# Patient Record
Sex: Male | Born: 1980 | Race: White | Hispanic: No | Smoking: Never smoker
Health system: Southern US, Community
[De-identification: ages and names within clinical notes are randomized; demographics above are authoritative.]

## PROBLEM LIST (undated history)

## (undated) DIAGNOSIS — I10 Essential (primary) hypertension: Secondary | ICD-10-CM

## (undated) HISTORY — PX: OTHER SURGICAL HISTORY: SHX169

---

## 2004-11-01 ENCOUNTER — Emergency Department (HOSPITAL_COMMUNITY): Admission: EM | Admit: 2004-11-01 | Discharge: 2004-11-02 | Payer: Self-pay | Admitting: Emergency Medicine

## 2006-02-19 ENCOUNTER — Emergency Department (HOSPITAL_COMMUNITY): Admission: EM | Admit: 2006-02-19 | Discharge: 2006-02-19 | Payer: Self-pay | Admitting: Family Medicine

## 2007-04-08 ENCOUNTER — Emergency Department (HOSPITAL_COMMUNITY): Admission: EM | Admit: 2007-04-08 | Discharge: 2007-04-08 | Payer: Self-pay | Admitting: Emergency Medicine

## 2007-05-12 ENCOUNTER — Emergency Department (HOSPITAL_COMMUNITY): Admission: EM | Admit: 2007-05-12 | Discharge: 2007-05-12 | Payer: Self-pay | Admitting: Emergency Medicine

## 2008-03-03 ENCOUNTER — Ambulatory Visit: Payer: Self-pay | Admitting: Family Medicine

## 2008-03-03 ENCOUNTER — Encounter (INDEPENDENT_AMBULATORY_CARE_PROVIDER_SITE_OTHER): Payer: Self-pay | Admitting: Family Medicine

## 2008-03-03 DIAGNOSIS — R030 Elevated blood-pressure reading, without diagnosis of hypertension: Secondary | ICD-10-CM | POA: Insufficient documentation

## 2008-03-03 DIAGNOSIS — R5383 Other fatigue: Secondary | ICD-10-CM

## 2008-03-03 DIAGNOSIS — R5381 Other malaise: Secondary | ICD-10-CM | POA: Insufficient documentation

## 2008-03-03 LAB — CONVERTED CEMR LAB
ALT: 13 units/L (ref 0–53)
AST: 12 units/L (ref 0–37)
Albumin: 4.8 g/dL (ref 3.5–5.2)
Alkaline Phosphatase: 50 units/L (ref 39–117)
BUN: 18 mg/dL (ref 6–23)
CO2: 24 meq/L (ref 19–32)
Calcium: 9.6 mg/dL (ref 8.4–10.5)
Chloride: 104 meq/L (ref 96–112)
Cholesterol: 243 mg/dL — ABNORMAL HIGH (ref 0–200)
Creatinine, Ser: 0.84 mg/dL (ref 0.40–1.50)
Direct LDL: 189 mg/dL — ABNORMAL HIGH
Glucose, Bld: 87 mg/dL (ref 70–99)
HCT: 46.1 % (ref 39.0–52.0)
HDL: 40 mg/dL (ref >39)
Hemoglobin: 15.4 g/dL (ref 13.0–17.0)
LDL Cholesterol: 161 mg/dL — ABNORMAL HIGH (ref 0–99)
MCHC: 33.4 g/dL (ref 30.0–36.0)
MCV: 83.8 fL (ref 78.0–100.0)
Platelets: 254 10*3/uL (ref 150–400)
Potassium: 4.2 meq/L (ref 3.5–5.3)
RBC: 5.5 M/uL (ref 4.22–5.81)
RDW: 13.1 % (ref 11.5–15.5)
Sodium: 140 meq/L (ref 135–145)
TSH: 2.173 microintl units/mL (ref 0.350–5.50)
Total Bilirubin: 0.4 mg/dL (ref 0.3–1.2)
Total CHOL/HDL Ratio: 6.1
Total Protein: 8 g/dL (ref 6.0–8.3)
Triglycerides: 212 mg/dL — ABNORMAL HIGH (ref <150)
VLDL: 42 mg/dL — ABNORMAL HIGH (ref 0–40)
WBC: 4.1 10*3/uL (ref 4.0–10.5)

## 2008-03-04 ENCOUNTER — Encounter (INDEPENDENT_AMBULATORY_CARE_PROVIDER_SITE_OTHER): Payer: Self-pay | Admitting: Family Medicine

## 2008-04-07 ENCOUNTER — Encounter (INDEPENDENT_AMBULATORY_CARE_PROVIDER_SITE_OTHER): Payer: Self-pay | Admitting: Family Medicine

## 2008-04-07 ENCOUNTER — Ambulatory Visit (HOSPITAL_COMMUNITY): Admission: RE | Admit: 2008-04-07 | Discharge: 2008-04-07 | Payer: Self-pay | Admitting: Family Medicine

## 2008-04-07 ENCOUNTER — Ambulatory Visit: Payer: Self-pay | Admitting: Family Medicine

## 2008-04-07 DIAGNOSIS — E785 Hyperlipidemia, unspecified: Secondary | ICD-10-CM | POA: Insufficient documentation

## 2008-04-07 DIAGNOSIS — R002 Palpitations: Secondary | ICD-10-CM | POA: Insufficient documentation

## 2008-04-07 DIAGNOSIS — G43909 Migraine, unspecified, not intractable, without status migrainosus: Secondary | ICD-10-CM | POA: Insufficient documentation

## 2008-04-07 LAB — CONVERTED CEMR LAB
CO2: 25 meq/L (ref 19–32)
Calcium: 9.4 mg/dL (ref 8.4–10.5)
Chloride: 105 meq/L (ref 96–112)
Creatinine, Ser: 0.91 mg/dL (ref 0.40–1.50)
Potassium: 4 meq/L (ref 3.5–5.3)
Sodium: 141 meq/L (ref 135–145)
TSH: 1.01 microintl units/mL (ref 0.350–5.50)

## 2008-04-09 ENCOUNTER — Encounter (INDEPENDENT_AMBULATORY_CARE_PROVIDER_SITE_OTHER): Payer: Self-pay | Admitting: Family Medicine

## 2008-04-12 ENCOUNTER — Encounter (INDEPENDENT_AMBULATORY_CARE_PROVIDER_SITE_OTHER): Payer: Self-pay | Admitting: Family Medicine

## 2008-05-14 ENCOUNTER — Encounter: Admission: RE | Admit: 2008-05-14 | Discharge: 2008-05-14 | Payer: Self-pay | Admitting: Family Medicine

## 2008-05-14 ENCOUNTER — Ambulatory Visit: Payer: Self-pay | Admitting: Family Medicine

## 2008-05-14 ENCOUNTER — Telehealth: Payer: Self-pay | Admitting: *Deleted

## 2008-05-14 ENCOUNTER — Encounter (INDEPENDENT_AMBULATORY_CARE_PROVIDER_SITE_OTHER): Payer: Self-pay | Admitting: Family Medicine

## 2008-05-14 DIAGNOSIS — M79609 Pain in unspecified limb: Secondary | ICD-10-CM | POA: Insufficient documentation

## 2008-05-14 LAB — CONVERTED CEMR LAB
BUN: 15 mg/dL (ref 6–23)
CO2: 22 meq/L (ref 19–32)
Calcium: 9.8 mg/dL (ref 8.4–10.5)
Chloride: 104 meq/L (ref 96–112)
Creatinine, Ser: 0.81 mg/dL (ref 0.40–1.50)
Glucose, Bld: 85 mg/dL (ref 70–99)
Potassium: 3.9 meq/L (ref 3.5–5.3)
Sodium: 140 meq/L (ref 135–145)

## 2008-11-20 ENCOUNTER — Emergency Department (HOSPITAL_COMMUNITY): Admission: EM | Admit: 2008-11-20 | Discharge: 2008-11-20 | Payer: Self-pay | Admitting: Family Medicine

## 2009-01-12 ENCOUNTER — Emergency Department (HOSPITAL_COMMUNITY): Admission: EM | Admit: 2009-01-12 | Discharge: 2009-01-12 | Payer: Self-pay | Admitting: Family Medicine

## 2011-08-31 LAB — POCT RAPID STREP A: Streptococcus, Group A Screen (Direct): NEGATIVE

## 2011-09-12 LAB — COMPREHENSIVE METABOLIC PANEL
ALT: 19
AST: 16
Albumin: 4
Alkaline Phosphatase: 44
BUN: 12
CO2: 28
Calcium: 9
Chloride: 102
Creatinine, Ser: 1.02
GFR calc Af Amer: >60
GFR calc non Af Amer: >60
Glucose, Bld: 106 — ABNORMAL HIGH
Potassium: 3.4 — ABNORMAL LOW
Sodium: 135
Total Bilirubin: 0.5
Total Protein: 6.7

## 2011-09-12 LAB — DIFFERENTIAL
Basophils Absolute: 0
Basophils Relative: 1
Eosinophils Absolute: 0.1
Eosinophils Relative: 2
Lymphocytes Relative: 39
Lymphs Abs: 2
Monocytes Absolute: 0.4
Monocytes Relative: 8
Neutro Abs: 2.6
Neutrophils Relative %: 50

## 2011-09-12 LAB — CBC
HCT: 42.8
Hemoglobin: 14.4
MCHC: 33.7
MCV: 83.1
Platelets: 238
RBC: 5.15
RDW: 13.3
WBC: 5.1

## 2011-09-12 LAB — LIPASE, BLOOD: Lipase: 27

## 2012-05-27 ENCOUNTER — Emergency Department (HOSPITAL_COMMUNITY)
Admission: EM | Admit: 2012-05-27 | Discharge: 2012-05-27 | Disposition: A | Discharge status: Home / Self Care | Payer: Self-pay | Attending: Emergency Medicine | Admitting: Emergency Medicine

## 2012-05-27 ENCOUNTER — Encounter (HOSPITAL_COMMUNITY): Payer: Self-pay | Admitting: *Deleted

## 2012-05-27 ENCOUNTER — Emergency Department (HOSPITAL_COMMUNITY): Payer: Self-pay

## 2012-05-27 DIAGNOSIS — I10 Essential (primary) hypertension: Secondary | ICD-10-CM | POA: Insufficient documentation

## 2012-05-27 DIAGNOSIS — W1809XA Striking against other object with subsequent fall, initial encounter: Secondary | ICD-10-CM | POA: Insufficient documentation

## 2012-05-27 DIAGNOSIS — R079 Chest pain, unspecified: Secondary | ICD-10-CM | POA: Insufficient documentation

## 2012-05-27 DIAGNOSIS — S2239XA Fracture of one rib, unspecified side, initial encounter for closed fracture: Secondary | ICD-10-CM | POA: Insufficient documentation

## 2012-05-27 DIAGNOSIS — S2231XA Fracture of one rib, right side, initial encounter for closed fracture: Secondary | ICD-10-CM

## 2012-05-27 HISTORY — DX: Essential (primary) hypertension: I10

## 2012-05-27 NOTE — ED Notes (Signed)
The pt slipped and fell in the bathtub on Friday.  He has had some rt lateral rib  Pain since then

## 2012-05-27 NOTE — ED Provider Notes (Signed)
History     CSN: [id number]  Arrival date & time 05/27/12  1191   First MD Initiated Contact with Patient 05/27/12 2027      Chief Complaint  Patient presents with  . Rib Injury    (Consider location/radiation/quality/duration/timing/severity/associated sxs/prior treatment) HPI  31 year old male presents complaining of right rib pain. Patient reports he accidentally slipped and fell, hitting edge of bathtub several days ago has had pain to his right ribs since.  Pain is primarily to his right lower chest. Pain is sharp and throbbing, nonradiating. Pain worsened with taking deep breath, cough, or with palpation. Denies fever, hemoptysis, productive cough, or abdominal pain. Denies swelling or bruising or abnormal bleeding. Has tried ibuprofen with some relief. Denies any other injury.  Past Medical History  Diagnosis Date  . Hypertension     History reviewed. No pertinent past surgical history.  No family history on file.  History  Substance Use Topics  . Smoking status: Never Smoker   . Smokeless tobacco: Not on file  . Alcohol Use: No      Review of Systems  Constitutional: Negative for fever and chills.  Respiratory: Negative for cough, chest tightness, shortness of breath and wheezing.   Cardiovascular: Positive for chest pain.  Skin: Negative for rash and wound.  All other systems reviewed and are negative.    Allergies  Review of patient's allergies indicates no known allergies.  Home Medications   Current Outpatient Rx  Name Route Sig Dispense Refill  . IBUPROFEN 200 MG PO TABS Oral Take 200 mg by mouth every 6 (six) hours as needed. For pain      BP 151/95  Pulse 70  Temp 98.3 F (36.8 C) (Oral)  Resp 16  SpO2 98%  Physical Exam  Nursing note and vitals reviewed. Constitutional: He is oriented to person, place, and time. He appears well-developed and well-nourished. No distress.  HENT:  Head: Atraumatic.  Eyes: Conjunctivae are normal.  Neck:  Neck supple.  Cardiovascular: Normal rate and regular rhythm.  Exam reveals no gallop and no friction rub.   No murmur heard. Pulmonary/Chest: Effort normal and breath sounds normal. No respiratory distress. He has no wheezes. He has no rales. He exhibits tenderness.    Abdominal: Soft. There is no tenderness.  Musculoskeletal: Normal range of motion.  Neurological: He is alert and oriented to person, place, and time.  Skin: Skin is warm. No rash noted.    ED Course  Procedures (including critical care time)  Labs Reviewed - No data to display No results found.   No diagnosis found.  Results for orders placed during the hospital encounter of 11/20/08  POCT RAPID STREP A (DEVICE)      Component Value Range   Streptococcus, Group A Screen (Direct) NEGATIVE  NEGATIVE   Dg Ribs Unilateral W/chest Right  05/27/2012  *RADIOLOGY REPORT*  Clinical Data: Rib injury.  Pain under the right breast.  Slipped and fell on Friday.  RIGHT RIBS AND CHEST - 3+ VIEW  Comparison: Chest x-ray 05/12/2007  Findings: Cardiomediastinal silhouette is within normal limits. The lungs are free of focal consolidations and pleural effusions. Oblique views demonstrate a minimally displaced fracture of the right seventh rib.  IMPRESSION:  1.  No evidence for acute cardiopulmonary disease. 2.  Fracture of the right lateral seventh rib.  Original Report Authenticated By: Patterson Hammersmith, M.D.    1. Right rib fracture  MDM  Right rib pain after falling. X-ray revealed a fracture  of the right lateral seventh rib. Care instruction given. Incentive spirometer getting along with appropriate usage.  Pt voice understanding.         Fayrene Helper, PA-C 05/27/12 2114

## 2012-05-28 NOTE — ED Provider Notes (Signed)
Medical screening examination/treatment/procedure(s) were performed by non-physician practitioner and as supervising physician I was immediately available for consultation/collaboration.  Taiyo Kozma R. Takiera Mayo, MD 05/28/12 0010 

## 2018-02-08 ENCOUNTER — Other Ambulatory Visit: Payer: Self-pay

## 2018-02-08 ENCOUNTER — Emergency Department (HOSPITAL_COMMUNITY)
Admission: EM | Admit: 2018-02-08 | Discharge: 2018-02-09 | Disposition: A | Discharge status: Home / Self Care | Payer: BLUE CROSS/BLUE SHIELD | Attending: Emergency Medicine | Admitting: Emergency Medicine

## 2018-02-08 ENCOUNTER — Emergency Department (HOSPITAL_COMMUNITY): Payer: BLUE CROSS/BLUE SHIELD

## 2018-02-08 DIAGNOSIS — Y999 Unspecified external cause status: Secondary | ICD-10-CM | POA: Diagnosis not present

## 2018-02-08 DIAGNOSIS — S8264XA Nondisplaced fracture of lateral malleolus of right fibula, initial encounter for closed fracture: Secondary | ICD-10-CM | POA: Insufficient documentation

## 2018-02-08 DIAGNOSIS — Y939 Activity, unspecified: Secondary | ICD-10-CM | POA: Diagnosis not present

## 2018-02-08 DIAGNOSIS — S92324A Nondisplaced fracture of second metatarsal bone, right foot, initial encounter for closed fracture: Secondary | ICD-10-CM | POA: Diagnosis not present

## 2018-02-08 DIAGNOSIS — I10 Essential (primary) hypertension: Secondary | ICD-10-CM | POA: Insufficient documentation

## 2018-02-08 DIAGNOSIS — Y92410 Unspecified street and highway as the place of occurrence of the external cause: Secondary | ICD-10-CM | POA: Diagnosis not present

## 2018-02-08 DIAGNOSIS — S99921A Unspecified injury of right foot, initial encounter: Secondary | ICD-10-CM | POA: Diagnosis present

## 2018-02-08 MED ORDER — OXYCODONE-ACETAMINOPHEN 5-325 MG PO TABS
1.0000 | ORAL_TABLET | Freq: Once | ORAL | Status: AC
Start: 2018-02-09 — End: 2018-02-09
  Administered 2018-02-09: 1 via ORAL
  Filled 2018-02-08: qty 1
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
## 2018-02-09 DIAGNOSIS — S92324A Nondisplaced fracture of second metatarsal bone, right foot, initial encounter for closed fracture: Secondary | ICD-10-CM | POA: Diagnosis not present

## 2018-02-09 MED ORDER — TRAMADOL HCL 50 MG PO TABS: 50.0000 mg | ORAL_TABLET | Freq: Four times a day (QID) | ORAL | 0 refills | Status: DC | PRN
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
## 2018-02-20 ENCOUNTER — Other Ambulatory Visit: Payer: Self-pay | Admitting: Specialist

## 2018-02-20 DIAGNOSIS — M79671 Pain in right foot: Secondary | ICD-10-CM

## 2018-02-28 ENCOUNTER — Other Ambulatory Visit: Payer: BLUE CROSS/BLUE SHIELD

## 2018-02-28 ENCOUNTER — Ambulatory Visit
Admission: RE | Admit: 2018-02-28 | Discharge: 2018-02-28 | Disposition: A | Discharge status: Home / Self Care | Payer: BLUE CROSS/BLUE SHIELD | Source: Ambulatory Visit | Attending: Specialist | Admitting: Specialist

## 2018-02-28 DIAGNOSIS — M79671 Pain in right foot: Secondary | ICD-10-CM

## 2018-07-01 ENCOUNTER — Ambulatory Visit: Payer: BLUE CROSS/BLUE SHIELD | Admitting: Physician Assistant

## 2018-07-01 ENCOUNTER — Other Ambulatory Visit: Payer: Self-pay

## 2018-07-01 ENCOUNTER — Encounter: Payer: Self-pay | Admitting: Physician Assistant

## 2018-07-01 VITALS — BP 118/82 | HR 70 | Temp 98.0°F | Resp 16 | Ht 73.62 in | Wt 213.0 lb

## 2018-07-01 DIAGNOSIS — Z23 Encounter for immunization: Secondary | ICD-10-CM | POA: Diagnosis not present

## 2018-07-01 DIAGNOSIS — Z7689 Persons encountering health services in other specified circumstances: Secondary | ICD-10-CM

## 2018-07-01 DIAGNOSIS — S80922A Unspecified superficial injury of left lower leg, initial encounter: Secondary | ICD-10-CM

## 2018-07-01 DIAGNOSIS — T148XXA Other injury of unspecified body region, initial encounter: Secondary | ICD-10-CM

## 2018-07-01 NOTE — Patient Instructions (Addendum)
Follow up for complete physical exam when you are ready. Thank you for letting me participate in your health and well being.   Abrasion An abrasion is a cut or scrape on the surface of your skin. An abrasion does not go through all of the layers of your skin. It is important to take good care of your abrasion to prevent infection. Follow these instructions at home: Medicines  Take or apply medicines only as told by your doctor.  If you were prescribed an antibiotic ointment, finish all of it even if you start to feel better. Wound care  Clean the wound with mild soap and water 2-3 times per day or as told by your doctor. Pat your wound dry with a clean towel. Do not rub it.  There are many ways to close and cover a wound. Follow instructions from your doctor about: ? How to take care of your wound. ? When and how you should change your bandage (dressing). ? When and how you should take off your dressing.  Check your wound every day for signs of infection. Watch for: ? Redness, swelling, or pain. ? Fluid, blood, or pus. General instructions  Keep the dressing dry as told by your doctor. Do not take baths, swim, use a hot tub, or do anything that would put your wound underwater until your doctor says it is okay.  If there is swelling, raise (elevate) the injured area above the level of your heart while you are sitting or lying down.  Keep all follow-up visits as told by your doctor. This is important. Contact a doctor if:  You were given a tetanus shot and you have any of these where the needle went in: ? Swelling. ? Very bad pain. ? Redness. ? Bleeding.  Medicine does not help your pain.  You have any of these at the site of the wound: ? More redness. ? More swelling. ? More pain. Get help right away if:  You have a red streak going away from your wound.  You have a fever.  You have fluid, blood, or pus coming from your wound.  There is a bad smell coming from your  wound. This information is not intended to replace advice given to you by your health care provider. Make sure you discuss any questions you have with your health care provider. Document Released: 04/30/2008 Document Revised: 04/19/2016 Document Reviewed: 11/10/2014 Elsevier Interactive Patient Education  2018 [employer]    IF you received an x-ray today, you will receive an invoice from [facility]. Please contact [facility] at [phone] with questions or concerns regarding your invoice.   IF you received labwork today, you will receive an invoice from [facility]. Please contact [facility] at [phone] with questions or concerns regarding your invoice.   Our billing staff will not be able to assist you with questions regarding bills from these companies.  You will be contacted with the lab results as soon as they are available. The fastest way to get your results is to activate your My Chart account. Instructions are located on the last page of this paperwork. If you have not heard from us regarding the results in 2 weeks, please contact this office.

## 2018-07-01 NOTE — Progress Notes (Signed)
   Ryan Mcconnell  MRN: [id number] DOB: 07-27-1981  Subjective:  Ryan Mcconnell is a 37 y.o. male seen in office today for a chief complaint of skin abrasion to left lower extremity today at work. Accidentally scraped it up against a wooden palate. Wiped it down with paper towel and cleanser immediately after. Last Tdap vaccine was given in 2005. Denies smoking. No PMH of diabetes.   Also wishes to establish care. Does not have a PCP. Has not had lab work in a long time. Thinks he used to have resting HTN but has not had issues in quite some time.   Review of Systems  Constitutional: Negative for chills, diaphoresis and fever.    Patient Active Problem List   Diagnosis Date Noted  . HAND PAIN, RIGHT 05/14/2008  . HYPERLIPIDEMIA 04/07/2008  . MIGRAINE HEADACHE 04/07/2008  . PALPITATIONS 04/07/2008  . FATIGUE 03/03/2008  . ELEVATED BLOOD PRESSURE 03/03/2008    No current outpatient medications on file prior to visit.   No current facility-administered medications on file prior to visit.     No Known Allergies   Objective:  BP 118/82   Pulse 70   Temp 98 F (36.7 C) (Oral)   Resp 16   Ht 6' 1.62" (1.87 m)   Wt 213 lb (96.6 kg)   SpO2 98%   BMI 27.63 kg/m   Physical Exam  Constitutional: He is oriented to person, place, and time. He appears well-developed and well-nourished. No distress.  HENT:  Head: Normocephalic and atraumatic.  Mouth/Throat: Uvula is midline, oropharynx is clear and moist and mucous membranes are normal. No tonsillar exudate.  Eyes: Pupils are equal, round, and reactive to light. Conjunctivae and EOM are normal.  Neck: Normal range of motion.  Cardiovascular: Normal rate, regular rhythm, normal heart sounds and intact distal pulses.  Pulmonary/Chest: Effort normal and breath sounds normal. He has no decreased breath sounds. He has no wheezes. He has no rhonchi. He has no rales.  Musculoskeletal:       Right lower leg: He exhibits no swelling.       Left lower leg: He exhibits no swelling.  Neurological: He is alert and oriented to person, place, and time.  Skin: Skin is warm and dry. Abrasion (on lateral LLE, no active bleeding, no surrounding erythema, warmth, or TTP, no purulent drainage) noted.  Psychiatric: He has a normal mood and affect.  Vitals reviewed.  Wound care: Verbal consent obtained.  Wound cleansed with soap and water. Mupirocin ointment and dressing applied.  Pt tolerated well.  After care instructions given to patient.    Assessment and Plan :  1. Skin abrasion Wound cleansed and dressed in office.  No signs of infection at this time.  Given educational material for wound care.  Updated Tdap vaccine.  Educated on signs and symptoms of surrounding infection.  Follow-up as needed. - Tdap vaccine greater than or equal to 7yo IM  2. Encounter to establish care I am happy to take over patient's care.  Recommend he follow-up in the next few weeks for complete physical exam he is fasting.   Benjiman CoreBrittany Faduma Cho PA-C  [facility]  [facility] 07/01/2018 1:42 PM

## 2018-07-11 ENCOUNTER — Other Ambulatory Visit: Payer: Self-pay

## 2018-07-11 ENCOUNTER — Ambulatory Visit (INDEPENDENT_AMBULATORY_CARE_PROVIDER_SITE_OTHER): Payer: BLUE CROSS/BLUE SHIELD | Admitting: Physician Assistant

## 2018-07-11 ENCOUNTER — Encounter: Payer: Self-pay | Admitting: Physician Assistant

## 2018-07-11 VITALS — BP 131/87 | HR 84 | Temp 98.8°F | Resp 20 | Ht 73.35 in | Wt 207.8 lb

## 2018-07-11 DIAGNOSIS — Z13220 Encounter for screening for lipoid disorders: Secondary | ICD-10-CM

## 2018-07-11 DIAGNOSIS — Z13228 Encounter for screening for other metabolic disorders: Secondary | ICD-10-CM

## 2018-07-11 DIAGNOSIS — Z1389 Encounter for screening for other disorder: Secondary | ICD-10-CM

## 2018-07-11 DIAGNOSIS — Z0000 Encounter for general adult medical examination without abnormal findings: Secondary | ICD-10-CM

## 2018-07-11 DIAGNOSIS — Z130 Encounter for screening for diseases of the blood and blood-forming organs and certain disorders involving the immune mechanism: Secondary | ICD-10-CM

## 2018-07-11 DIAGNOSIS — Z113 Encounter for screening for infections with a predominantly sexual mode of transmission: Secondary | ICD-10-CM

## 2018-07-11 DIAGNOSIS — F32A Depression, unspecified: Secondary | ICD-10-CM

## 2018-07-11 DIAGNOSIS — Z0001 Encounter for general adult medical examination with abnormal findings: Secondary | ICD-10-CM | POA: Diagnosis not present

## 2018-07-11 DIAGNOSIS — I459 Conduction disorder, unspecified: Secondary | ICD-10-CM | POA: Diagnosis not present

## 2018-07-11 DIAGNOSIS — G479 Sleep disorder, unspecified: Secondary | ICD-10-CM | POA: Diagnosis not present

## 2018-07-11 DIAGNOSIS — F329 Major depressive disorder, single episode, unspecified: Secondary | ICD-10-CM

## 2018-07-11 DIAGNOSIS — F419 Anxiety disorder, unspecified: Secondary | ICD-10-CM

## 2018-07-11 MED ORDER — HYDROXYZINE HCL 25 MG PO TABS: 12.5000 mg | ORAL_TABLET | Freq: Every evening | ORAL | 0 refills | Status: DC | PRN

## 2018-07-11 MED ORDER — ESCITALOPRAM OXALATE 10 MG PO TABS: 10.0000 mg | ORAL_TABLET | Freq: Every day | ORAL | 0 refills | Status: DC

## 2018-07-11 NOTE — Progress Notes (Signed)
Ryan Mcconnell  MRN: [id number] DOB: [birth year]  Subjective:  Pt is a 37 y.o. male who presents for annual physical exam. Pt is fasting today.   Diet: "dont often eat breakfast" "lunch is often takeout, quite fond of chicken, zaxbys, food lion, not a lot of greasty food" "dinner varies, will stick with lean meats" Drinks mostly water. Will often drink 1-2 energy drinks per day (11am and mid afternoon).  Exercise: Daily, commutes via bicycle but also uses it as exercise.  Sleep: Having issues with sleep. Will average 4-6 hours a night. Staying asleep is the issue. No issues with falling asleep. Will lie in bed and read on iphone. Does snore sometimes. Denies apneic episodes. Benedryl knocks him out and makes him feel drowsy, melatonin does not help him stay asleep.  BM: Daily  Last dental exam: "been awhile" does not brush twice daily.  Last vision exam: 11/2017, wears Rx eyeglasses.   Vaccinations      Tetanus: 2019   Issues wanting to discuss: Anxiety: Has had excessive worrying for "as long as I can remember."  They are things that are mundane and should not require this much thought. He is always thinking " I need to get this done." Worries about what will happen if he does not get that done. "I will emotionally shut down every now and then, struggling with depression for sometime."  Has associated sleep disturbance. Denies panic attacks, hallucinations, euphoria, manic episodes, impulsive behaviors, compulsive behaviors, lack of need of sleep, SI or HI. No illicit drug use. No PMH of seizures.  No FH of mental illness he is aware of.   Palpitations: Occurs occasionally x "as long as I can remember."   Will feel "skipped beat." Had work up in ED 15 years ago with no definitive dx. Will occur at anytime, 1-2 x per day. No association with anything he can think of. Denies chest pain, SOB, orthopnea, lower leg swelling. Does drink caffeine daily. Only OTC medication is ibuprofen  occassioanally. No illicit drug use. No FH of heart disease or thyroid disease he is aware of.     Patient Active Problem List   Diagnosis Date Noted  . Migraine headache 04/07/2008  . Palpitations 04/07/2008    No current outpatient medications on file prior to visit.   No current facility-administered medications on file prior to visit.     No Known Allergies  Social History   Socioeconomic History  . Marital status: Divorced    Spouse name: Not on file  . Number of children: 1  . Years of education: Not on file  . Highest education level: High school graduate  Occupational History  . Not on file  Social Needs  . Financial resource strain: Not hard at all  . Food insecurity:    Worry: Never true    Inability: Never true  . Transportation needs:    Medical: No    Non-medical: No  Tobacco Use  . Smoking status: Never Smoker  . Smokeless tobacco: Never Used  Substance and Sexual Activity  . Alcohol use: Yes    Comment: 1-2 drinks per week  . Drug use: Never  . Sexual activity: Yes    Partners: Female  Lifestyle  . Physical activity:    Days per week: 7 days    Minutes per session: 100 min  . Stress: Rather much  Relationships  . Social connections:    Talks on phone: Once a week    Gets  together: Once a week    Attends religious service: Never    Active member of club or organization: No    Attends meetings of clubs or organizations: Never    Relationship status: Divorced  Other Topics Concern  . Not on file  Social History Narrative   Pt is from [place]. Moved to [place] at age 89. Divorced, one child, joint custody.     No past surgical history on file.  No family history on file.  Review of Systems  Constitutional: Negative for activity change, appetite change, chills, diaphoresis, fatigue, fever and unexpected weight change.  HENT: Negative for congestion, dental problem, drooling, ear discharge, ear pain, facial swelling, hearing loss,  mouth sores, nosebleeds, postnasal drip, rhinorrhea, sinus pressure, sinus pain, sneezing, sore throat, tinnitus, trouble swallowing and voice change.   Eyes: Negative for photophobia, pain, discharge, redness, itching and visual disturbance.  Respiratory: Negative for apnea, cough, choking, chest tightness, shortness of breath, wheezing and stridor.   Cardiovascular: Positive for palpitations. Negative for chest pain and leg swelling.  Gastrointestinal: Negative for abdominal distention, abdominal pain, anal bleeding, blood in stool, constipation, diarrhea, nausea, rectal pain and vomiting.  Endocrine: Negative for cold intolerance, heat intolerance, polydipsia, polyphagia and polyuria.  Genitourinary: Negative for decreased urine volume, difficulty urinating, discharge, dysuria, enuresis, flank pain, frequency, genital sores, hematuria, penile pain, penile swelling, scrotal swelling, testicular pain and urgency.  Musculoskeletal: Negative for arthralgias, back pain, gait problem, joint swelling, myalgias, neck pain and neck stiffness.  Skin: Negative for color change, pallor, rash and wound.  Allergic/Immunologic: Negative for environmental allergies, food allergies and immunocompromised state.  Neurological: Positive for headaches. Negative for dizziness, tremors, seizures, syncope, facial asymmetry, speech difficulty, weakness, light-headedness and numbness.  Hematological: Negative for adenopathy. Does not bruise/bleed easily.  Psychiatric/Behavioral: Positive for sleep disturbance. Negative for agitation, behavioral problems, confusion, decreased concentration, dysphoric mood, hallucinations, self-injury and suicidal ideas. The patient is nervous/anxious. The patient is not hyperactive.     Objective:  BP 131/87   Pulse 84   Temp 98.8 F (37.1 C) (Oral)   Resp 20   Ht 6' 1.35" (1.863 m)   Wt 207 lb 12.8 oz (94.3 kg)   SpO2 97%   BMI 27.16 kg/m   Physical Exam  Constitutional: He is  oriented to person, place, and time. He appears well-developed and well-nourished. No distress.  HENT:  Head: Normocephalic and atraumatic.  Right Ear: Hearing, tympanic membrane, external ear and ear canal normal.  Left Ear: Hearing, tympanic membrane, external ear and ear canal normal.  Nose: Nose normal.  Mouth/Throat: Uvula is midline, oropharynx is clear and moist and mucous membranes are normal. No oropharyngeal exudate.  Eyes: Pupils are equal, round, and reactive to light. Conjunctivae and EOM are normal.  Neck: Trachea normal and normal range of motion.  Cardiovascular: Normal rate, regular rhythm, normal heart sounds and intact distal pulses.  Pulmonary/Chest: Effort normal and breath sounds normal.  Abdominal: Soft. Normal appearance and bowel sounds are normal.  Musculoskeletal: Normal range of motion.  Lymphadenopathy:       Head (right side): No submental, no submandibular, no tonsillar, no preauricular, no posterior auricular and no occipital adenopathy present.       Head (left side): No submental, no submandibular, no tonsillar, no preauricular, no posterior auricular and no occipital adenopathy present.    He has no cervical adenopathy.       Right: No supraclavicular adenopathy present.       Left:  No supraclavicular adenopathy present.  Neurological: He is alert and oriented to person, place, and time. He has normal strength and normal reflexes.  Skin: Skin is warm and dry.  Vitals reviewed.   Visual Acuity Screening   Right eye Left eye Both eyes  Without correction: 20/25 20/25 20/20  With correction:        BP Readings from Last 3 Encounters:  07/11/18 131/87  07/01/18 118/82  02/08/18 (!) 151/100   Depression screen PHQ 2/9 07/11/2018 07/11/2018 07/01/2018  Decreased Interest 0 0 0  Down, Depressed, Hopeless 1 0 0  PHQ - 2 Score 1 0 0  Altered sleeping 3 - -  Tired, decreased energy 2 - -  Change in appetite 0 - -  Feeling bad or failure about yourself  1  - -  Trouble concentrating 0 - -  Moving slowly or fidgety/restless 1 - -  Suicidal thoughts 0 - -  PHQ-9 Score 8 - -  Difficult doing work/chores Somewhat difficult - -   GAD 7 : Generalized Anxiety Score 07/11/2018  Nervous, Anxious, on Edge 3  Control/stop worrying 2  Worry too much - different things 3  Trouble relaxing 3  Restless 1  Easily annoyed or irritable 0  Afraid - awful might happen 0  Total GAD 7 Score 12  Anxiety Difficulty Not difficult at all   EKG shows NSR with rate of 62 bpm. PR and QRS intervals within normal limits. No acute changes noted from prior EKG of 2009.     Assessment and Plan :  Discussed healthy lifestyle, diet, exercise, preventative care, vaccinations, and addressed patient's concerns. Plan for follow up in 4-6 weeks. Otherwise, plan for specific conditions below.  1. Annual physical exam Await lab results.   2. Screening for deficiency anemia - CBC with Differential/Platelet  3. Screening for metabolic disorder - IDP82+UMPN  4. Screening cholesterol level - Lipid panel  5. Screening for hematuria or proteinuria - Urinalysis, dipstick only  6. Screen for STD (sexually transmitted disease) - GC/Chlamydia Probe Amp - Hepatitis panel, acute - HIV antibody - RPR - Trichomonas vaginalis, RNA  7. Sleep disturbance Will start with treatment of anxiety with SSRI and use atarax prn for sleep. Educated on sleep hygiene. Avoid screen time while in bed.  - TSH - hydrOXYzine (ATARAX/VISTARIL) 25 MG tablet; Take 0.5-1 tablets (12.5-25 mg total) by mouth at bedtime as needed.  Dispense: 30 tablet; Refill: 0  8. Skipped heart beats Could be related to caffeine consumption and/or anxiety. EKG normal. He is asx today. Labs pending. Rec decreasing caffeine intake. Start SSRI for anxiety. F/u with cardiology for possible holter monitor.  - TSH - EKG 12-Lead - Ambulatory referral to Cardiology  9. Anxiety and depression GAD 7 questionanaire  score of 12, PHQ 9 score of 8. No SI or HI. It appears pt suffers mostly from anxiety, likely GAD. Rec SSRI at this time. Also rec monthly therapy. F/u with me in office in 4-6 weeks. - TSH - escitalopram (LEXAPRO) 10 MG tablet; Take 1 tablet (10 mg total) by mouth daily.  Dispense: 90 tablet; Refill: 0 - Vitamin B12  Side effects, risks, benefits, and alternatives of the medications and treatment plan prescribed today were discussed, and patient expressed understanding of the instructions given. No barriers to understanding were identified. Red flags discussed in detail. Pt expressed understanding regarding what to do in case of emergency/urgent symptoms.  Tenna Delaine, PA-C  [facility] 07/11/2018 11:02 AM

## 2018-07-11 NOTE — Patient Instructions (Addendum)
For depression and anxiety, I recommend we start a daily SSRI and occassional therapy. I also recommend a short term medication for moments of sleep disturbance. The SSRI I am prescribing is called lexapro. Start with half a tablet x 4 days then increase to one tablet daily. Please keep in mind that when you start an SSRI it can worsen your depression and anxiety symptoms. It can also increase risk of suicidal thoughts so if this occurs, please seek care immediately. Common side effects of SSRIs include, but are not limited to, GI upset, nausea, vomiting, insomnia, and drowsiness. Typically these side effects will resolve after 2 weeks. Please keep in mind that it can take up to 4-6 weeks for SSRIs to be fully effective. Below is some information for local therapists. I recommend you reach out to one of these therapists before our next visit.Plan to return to clinic in 6 weeks for reevaluation of symptoms.   For therapy -- [facility] ([address] [place] [facility] [place]) - [phone] Lia Hopping [facility] [place]) - [phone] [facility] - [phone] [facility] - [phone] Buena Irish - [phone] [facility]  - [phone] (do not file insurance)   For heart palpitations, I recommend decrease caffeine consumption. Follow up with cardiology for evaluation. Thank you for letting me participate in your health and well being.    If you have lab work done today you will be contacted with your lab results within the next 2 weeks.  If you have not heard from [place] then please contact us. The fastest way to get your results is to register for My Chart.  Health Maintenance, Male A healthy lifestyle and preventive care is important for your health and wellness. Ask your health care provider about what schedule of regular examinations is right for you. What should I know about  weight and diet? Eat a Healthy Diet  Eat plenty of vegetables, fruits, whole grains, low-fat dairy products, and lean protein.  Do not eat a lot of foods high in solid fats, added sugars, or salt.  Maintain a Healthy Weight Regular exercise can help you achieve or maintain a healthy weight. You should:  Do at least 150 minutes of exercise each week. The exercise should increase your heart rate and make you sweat (moderate-intensity exercise).  Do strength-training exercises at least twice a week.  Watch Your Levels of Cholesterol and Blood Lipids  Have your blood tested for lipids and cholesterol every 5 years starting at 35 years of age. If you are at high risk for heart disease, you should start having your blood tested when you are 35 years old. You may need to have your cholesterol levels checked more often if: ? Your lipid or cholesterol levels are high. ? You are older than 35 years of age. ? You are at high risk for heart disease.  What should I know about cancer screening? Many types of cancers can be detected early and may often be prevented. Lung Cancer  You should be screened every year for lung cancer if: ? You are a current smoker who has smoked for at least 30 years. ? You are a former smoker who has quit within the past 15 years.  Talk to your health care provider about your screening options, when you should start screening, and how often you should be screened.  Colorectal Cancer  Routine colorectal cancer screening usually begins at 35 years of age  and should be repeated every 5-10 years until you are 35 years old. You may need to be screened more often if early forms of precancerous polyps or small growths are found. Your health care provider may recommend screening at an earlier age if you have risk factors for colon cancer.  Your health care provider may recommend using home test kits to check for hidden blood in the stool.  A small camera at the end of a  tube can be used to examine your colon (sigmoidoscopy or colonoscopy). This checks for the earliest forms of colorectal cancer.  Prostate and Testicular Cancer  Depending on your age and overall health, your health care provider may do certain tests to screen for prostate and testicular cancer.  Talk to your health care provider about any symptoms or concerns you have about testicular or prostate cancer.  Skin Cancer  Check your skin from head to toe regularly.  Tell your health care provider about any new moles or changes in moles, especially if: ? There is a change in a mole's size, shape, or color. ? You have a mole that is larger than a pencil eraser.  Always use sunscreen. Apply sunscreen liberally and repeat throughout the day.  Protect yourself by wearing long sleeves, pants, a wide-brimmed hat, and sunglasses when outside.  What should I know about heart disease, diabetes, and high blood pressure?  If you are 10418-35 years of age, have your blood pressure checked every 3-5 years. If you are 35 years of age or older, have your blood pressure checked every year. You should have your blood pressure measured twice-once when you are at a hospital or clinic, and once when you are not at a hospital or clinic. Record the average of the two measurements. To check your blood pressure when you are not at a hospital or clinic, you can use: ? An automated blood pressure machine at a pharmacy. ? A home blood pressure monitor.  Talk to your health care provider about your target blood pressure.  If you are between 4745-585 years old, ask your health care provider if you should take aspirin to prevent heart disease.  Have regular diabetes screenings by checking your fasting blood sugar level. ? If you are at a normal weight and have a low risk for diabetes, have this test once every three years after the age of 445. ? If you are overweight and have a high risk for diabetes, consider being tested at  a younger age or more often.  A one-time screening for abdominal aortic aneurysm (AAA) by ultrasound is recommended for men aged 65-75 years who are current or former smokers. What should I know about preventing infection? Hepatitis B If you have a higher risk for hepatitis B, you should be screened for this virus. Talk with your health care provider to find out if you are at risk for hepatitis B infection. Hepatitis C Blood testing is recommended for:  Everyone born from [birth year] through 1963.  Anyone with known risk factors for hepatitis C.  Sexually Transmitted Diseases (STDs)  You should be screened each year for STDs including gonorrhea and chlamydia if: ? You are sexually active and are younger than 35 years of age. ? You are older than 35 years of age and your health care provider tells you that you are at risk for this type of infection. ? Your sexual activity has changed since you were last screened and you are at an increased risk for  chlamydia or gonorrhea. Ask your health care provider if you are at risk.  Talk with your health care provider about whether you are at high risk of being infected with HIV. Your health care provider may recommend a prescription medicine to help prevent HIV infection.  What else can I do?  Schedule regular health, dental, and eye exams.  Stay current with your vaccines (immunizations).  Do not use any tobacco products, such as cigarettes, chewing tobacco, and e-cigarettes. If you need help quitting, ask your health care provider.  Limit alcohol intake to no more than 2 drinks per day. One drink equals 12 ounces of beer, 5 ounces of wine, or 1 ounces of hard liquor.  Do not use street drugs.  Do not share needles.  Ask your health care provider for help if you need support or information about quitting drugs.  Tell your health care provider if you often feel depressed.  Tell your health care provider if you have ever been abused or do not  feel safe at home. This information is not intended to replace advice given to you by your health care provider. Make sure you discuss any questions you have with your health care provider. Document Released: 05/10/2008 Document Revised: 07/11/2016 Document Reviewed: 08/16/2015 Elsevier Interactive Patient Education  2018 [employer]   IF you received an x-ray today, you will receive an invoice from [facility]. Please contact [facility] at [phone] with questions or concerns regarding your invoice.   IF you received labwork today, you will receive an invoice from [facility]. Please contact [facility] at [phone] with questions or concerns regarding your invoice.   Our billing staff will not be able to assist you with questions regarding bills from these companies.  You will be contacted with the lab results as soon as they are available. The fastest way to get your results is to activate your My Chart account. Instructions are located on the last page of this paperwork. If you have not heard from us regarding the results in 2 weeks, please contact this office.

## 2018-07-12 LAB — CBC WITH DIFFERENTIAL/PLATELET
BASOS ABS: 0 10*3/uL (ref 0.0–0.2)
Basos: 1 %
EOS (ABSOLUTE): 0.1 10*3/uL (ref 0.0–0.4)
Eos: 2 %
Hematocrit: 46.2 % (ref 37.5–51.0)
Hemoglobin: 15.5 g/dL (ref 13.0–17.7)
Immature Grans (Abs): 0 10*3/uL (ref 0.0–0.1)
Immature Granulocytes: 0 %
Lymphocytes Absolute: 1.1 10*3/uL (ref 0.7–3.1)
Lymphs: 28 %
MCH: 27.8 pg (ref 26.6–33.0)
MCHC: 33.5 g/dL (ref 31.5–35.7)
MCV: 83 fL (ref 79–97)
MONOS ABS: 0.3 10*3/uL (ref 0.1–0.9)
Monocytes: 8 %
Neutrophils Absolute: 2.3 10*3/uL (ref 1.4–7.0)
Neutrophils: 61 %
Platelets: 280 10*3/uL (ref 150–450)
RBC: 5.57 x10E6/uL (ref 4.14–5.80)
RDW: 14.5 % (ref 12.3–15.4)
WBC: 3.7 10*3/uL (ref 3.4–10.8)

## 2018-07-12 LAB — CMP14+EGFR
ALT: 17 IU/L (ref 0–44)
AST: 17 IU/L (ref 0–40)
Albumin/Globulin Ratio: 1.8 (ref 1.2–2.2)
Albumin: 4.9 g/dL (ref 3.5–5.5)
Alkaline Phosphatase: 59 IU/L (ref 39–117)
BILIRUBIN TOTAL: 0.6 mg/dL (ref 0.0–1.2)
BUN/Creatinine Ratio: 17 (ref 9–20)
BUN: 17 mg/dL (ref 6–20)
CHLORIDE: 105 mmol/L (ref 96–106)
CO2: 21 mmol/L (ref 20–29)
Calcium: 9.6 mg/dL (ref 8.7–10.2)
Creatinine, Ser: 1.02 mg/dL (ref 0.76–1.27)
GFR calc Af Amer: 110 mL/min/{1.73_m2} (ref >59)
GFR calc non Af Amer: 95 mL/min/{1.73_m2} (ref >59)
Globulin, Total: 2.8 g/dL (ref 1.5–4.5)
Glucose: 96 mg/dL (ref 65–99)
POTASSIUM: 4.3 mmol/L (ref 3.5–5.2)
Sodium: 145 mmol/L — ABNORMAL HIGH (ref 134–144)
Total Protein: 7.7 g/dL (ref 6.0–8.5)

## 2018-07-12 LAB — LIPID PANEL
Chol/HDL Ratio: 6.5 ratio — ABNORMAL HIGH (ref 0.0–5.0)
Cholesterol, Total: 258 mg/dL — ABNORMAL HIGH (ref 100–199)
HDL: 40 mg/dL (ref >39)
LDL Calculated: 182 mg/dL — ABNORMAL HIGH (ref 0–99)
Triglycerides: 179 mg/dL — ABNORMAL HIGH (ref 0–149)
VLDL Cholesterol Cal: 36 mg/dL (ref 5–40)

## 2018-07-12 LAB — HEPATITIS PANEL, ACUTE
Hep A IgM: NEGATIVE
Hep B C IgM: NEGATIVE
Hep C Virus Ab: 0.1 s/co ratio (ref 0.0–0.9)
Hepatitis B Surface Ag: NEGATIVE

## 2018-07-12 LAB — HIV ANTIBODY (ROUTINE TESTING W REFLEX): HIV Screen 4th Generation wRfx: NONREACTIVE

## 2018-07-12 LAB — TSH: TSH: 2.17 u[IU]/mL (ref 0.450–4.500)

## 2018-07-12 LAB — URINALYSIS, DIPSTICK ONLY
Bilirubin, UA: NEGATIVE
Glucose, UA: NEGATIVE
Ketones, UA: NEGATIVE
Leukocytes, UA: NEGATIVE
Nitrite, UA: NEGATIVE
Protein, UA: NEGATIVE
RBC, UA: NEGATIVE
Specific Gravity, UA: 1.022 (ref 1.005–1.030)
Urobilinogen, Ur: 0.2 mg/dL (ref 0.2–1.0)
pH, UA: 7 (ref 5.0–7.5)

## 2018-07-12 LAB — VITAMIN B12: Vitamin B-12: 387 pg/mL (ref 232–1245)

## 2018-07-12 LAB — RPR: RPR: NONREACTIVE

## 2018-07-14 ENCOUNTER — Encounter: Payer: Self-pay | Admitting: Physician Assistant

## 2018-07-15 LAB — GC/CHLAMYDIA PROBE AMP
CHLAMYDIA, DNA PROBE: NEGATIVE
NEISSERIA GONORRHOEAE BY PCR: NEGATIVE

## 2018-07-15 LAB — TRICHOMONAS VAGINALIS, PROBE AMP: Trich vag by NAA: NEGATIVE

## 2018-08-11 NOTE — Progress Notes (Signed)
Ryan Mcconnell  MRN: [id number] DOB: 1981-06-09  Subjective:  Ryan Mcconnell is a 37 y.o. male seen in office today for a chief complaint of f/u on anxiety and depression. Started on daily lexapro and prn hydroxyzine at 07/11/18. Today, reports he noticed a huge difference in his anxiety and depression after 2 weeks on the medication.  Reports feeling less anxious about every little detail.  He is able to do his daily tasks without constant worrying.  Noticed improvement in relaxing.  Also feels more energetic.  Has not reached out to a counselor because he wanted to see how the medication was working. Denies SI, HI, GI upset, nausea, vomiting, insomnia, and drowsiness. Has taken hydroxyzine a few times since last visit.  It does help with the increased anxiety but also makes him drowsy so he tries to use only on weekends.  Overall, he is feeling much better.  Review of Systems Per HPI  Patient Active Problem List   Diagnosis Date Noted  . Migraine headache 04/07/2008  . Palpitations 04/07/2008    Current Outpatient Medications on File Prior to Visit  Medication Sig Dispense Refill  . escitalopram (LEXAPRO) 10 MG tablet Take 1 tablet (10 mg total) by mouth daily. 90 tablet 0  . hydrOXYzine (ATARAX/VISTARIL) 25 MG tablet Take 0.5-1 tablets (12.5-25 mg total) by mouth at bedtime as needed. 30 tablet 0   No current facility-administered medications on file prior to visit.     No Known Allergies    Social History   Socioeconomic History  . Marital status: Divorced    Spouse name: Not on file  . Number of children: 1  . Years of education: Not on file  . Highest education level: High school graduate  Occupational History  . Not on file  Social Needs  . Financial resource strain: Not hard at all  . Food insecurity:    Worry: Never true    Inability: Never true  . Transportation needs:    Medical: No    Non-medical: No  Tobacco Use  . Smoking status: Never Smoker  .  Smokeless tobacco: Never Used  Substance and Sexual Activity  . Alcohol use: Yes    Comment: 1-2 drinks per week  . Drug use: Never  . Sexual activity: Yes    Partners: Female  Lifestyle  . Physical activity:    Days per week: 7 days    Minutes per session: 100 min  . Stress: Rather much  Relationships  . Social connections:    Talks on phone: Once a week    Gets together: Once a week    Attends religious service: Never    Active member of club or organization: No    Attends meetings of clubs or organizations: Never    Relationship status: Divorced  . Intimate partner violence:    Fear of current or ex partner: Patient refused    Emotionally abused: Patient refused    Physically abused: Patient refused    Forced sexual activity: Patient refused  Other Topics Concern  . Not on file  Social History Narrative   Pt is from [place]. Moved to [place] at age 52. Divorced, one child, joint custody.     Objective:  BP 126/84   Pulse 76   Temp 98.6 F (37 C) (Oral)   Resp 20   Ht 6' 1.7" (1.872 m)   Wt 211 lb 12.8 oz (96.1 kg)   SpO2 96%   BMI 27.41  kg/m   Physical Exam  Constitutional: He is oriented to person, place, and time. He appears well-developed and well-nourished. No distress.  HENT:  Head: Normocephalic and atraumatic.  Eyes: Conjunctivae are normal.  Neck: Normal range of motion.  Pulmonary/Chest: Effort normal.  Neurological: He is alert and oriented to person, place, and time.  Skin: Skin is warm and dry.  Psychiatric: He has a normal mood and affect.  Vitals reviewed.     GAD 7 : Generalized Anxiety Score 08/12/2018 07/11/2018  Nervous, Anxious, on Edge 0 3  Control/stop worrying 1 2  Worry too much - different things 1 3  Trouble relaxing 1 3  Restless 0 1  Easily annoyed or irritable 0 0  Afraid - awful might happen 0 0  Total GAD 7 Score 3 12  Anxiety Difficulty Not difficult at all Not difficult at all    Depression screen [facility]  08/12/2018 07/11/2018 07/11/2018 07/01/2018  Decreased Interest 1 0 0 0  Down, Depressed, Hopeless 0 1 0 0  PHQ - 2 Score 1 1 0 0  Altered sleeping 3 3 - -  Tired, decreased energy 1 2 - -  Change in appetite 0 0 - -  Feeling bad or failure about yourself  0 1 - -  Trouble concentrating 0 0 - -  Moving slowly or fidgety/restless 0 1 - -  Suicidal thoughts 0 0 - -  PHQ-9 Score 5 8 - -  Difficult doing work/chores Somewhat difficult Somewhat difficult - -     Assessment and Plan :  1. Anxiety and depression GAD 7 score has improved from 12 one month ago to 3 today.  PHQ 9 score has improved from 8 to 5.  He is tolerating medication well.  No SI or HI. Recommend continuing with Lexapro 10 mg daily.  Do believe the patient would benefit from having counseling on board.  Given contact info for local therapists.  Follow-up in office in 3 months. - escitalopram (LEXAPRO) 10 MG tablet; Take 1 tablet (10 mg total) by mouth daily.  Dispense: 90 tablet; Refill: 0  A total of 15 was spent in the room with the patient, greater than 50% of which was in counseling/coordination of care regarding anxiety and depression.   Benjiman CoreBrittany Jazmeen Axtell PA-C  [facility]  [facility] 08/12/2018 9:24 AM

## 2018-08-12 ENCOUNTER — Other Ambulatory Visit: Payer: Self-pay

## 2018-08-12 ENCOUNTER — Ambulatory Visit: Payer: BLUE CROSS/BLUE SHIELD | Admitting: Physician Assistant

## 2018-08-12 ENCOUNTER — Encounter: Payer: Self-pay | Admitting: Physician Assistant

## 2018-08-12 DIAGNOSIS — F419 Anxiety disorder, unspecified: Secondary | ICD-10-CM | POA: Diagnosis not present

## 2018-08-12 DIAGNOSIS — F32A Depression, unspecified: Secondary | ICD-10-CM

## 2018-08-12 DIAGNOSIS — F329 Major depressive disorder, single episode, unspecified: Secondary | ICD-10-CM | POA: Diagnosis not present

## 2018-08-12 MED ORDER — ESCITALOPRAM OXALATE 10 MG PO TABS: 10.0000 mg | ORAL_TABLET | Freq: Every day | ORAL | 0 refills | Status: DC

## 2018-08-12 NOTE — Patient Instructions (Addendum)
Your cardiology referral [facility] to [facility] [address], you can call them and schedule an appointment at your convenience.   [address]  [place],  [place]  [phone]  Main: [phone]    I recommend continuing with Lexapro 10 mg daily.  You still have a few more weeks before it reaches its optimal effect.  After few weeks, if you do not feel any additional improvement, I do recommend seeking out counseling.  Below is some information for local therapist.  Follow-up in 3 months for reevaluation.   For therapy -- [facility] ([address], Ernest McCoy, Heather Joycelyn SchmidKitchens, Karla [place]) - [phone] [facility]([place]ie [place]) - [phone] [facility] - [phone] [facility] - [phone] Buena IrishBob Mylan - [phone] [facility]  - [phone] (do not file insurance)    If you have lab work done today you will be contacted with your lab results within the next 2 weeks.  If you have not heard from us then please contact us. The fastest way to get your results is to register for My Chart.   IF you received an x-ray today, you will receive an invoice from [facility]. Please contact [facility] at [phone] with questions or concerns regarding your invoice.   IF you received labwork today, you will receive an invoice from [facility]. Please contact [facility] at [phone] with questions or concerns regarding your invoice.   Our billing staff will not be able to assist you with questions regarding bills from these companies.  You will be contacted with the lab results as soon as they are available. The fastest way to get your results is to activate your My Chart account. Instructions are located on the last page of this paperwork. If you have not heard from us regarding the results in 2 weeks, please contact this  office.

## 2018-09-01 NOTE — Progress Notes (Signed)
Referring-Brittany Barnett Abu, PA-C Reason for referral-palpitations  HPI: 37 year old male for evaluation of palpitations at request of [place], PA-C.  Labs August 2019 showed TSH 2.170, potassium 4.3 and hemoglobin 15.5.  Patient has had intermittent palpitations for years.  They are described as a skip and pause.  They are not sustained.  He also describes occasions where he cannot take a deep breath but does not have significant dyspnea on exertion, orthopnea, PND, pedal edema, exertional chest pain or syncope.  Because of the above cardiology asked to evaluate.  Current Outpatient Medications  Medication Sig Dispense Refill  . escitalopram (LEXAPRO) 10 MG tablet Take 1 tablet (10 mg total) by mouth daily. 90 tablet 0  . hydrOXYzine (ATARAX/VISTARIL) 25 MG tablet Take 0.5-1 tablets (12.5-25 mg total) by mouth at bedtime as needed. 30 tablet 0   No current facility-administered medications for this visit.     No Known Allergies   Past Medical History:  Diagnosis Date  . Hypertension     Past Surgical History:  Procedure Laterality Date  . No prior surgery      Social History   Socioeconomic History  . Marital status: Divorced    Spouse name: Not on file  . Number of children: 1  . Years of education: Not on file  . Highest education level: High school graduate  Occupational History  . Not on file  Social Needs  . Financial resource strain: Not hard at all  . Food insecurity:    Worry: Never true    Inability: Never true  . Transportation needs:    Medical: No    Non-medical: No  Tobacco Use  . Smoking status: Never Smoker  . Smokeless tobacco: Never Used  Substance and Sexual Activity  . Alcohol use: Yes    Comment: 1-2 drinks per week  . Drug use: Never  . Sexual activity: Yes    Partners: Female  Lifestyle  . Physical activity:    Days per week: 7 days    Minutes per session: 100 min  . Stress: Rather much  Relationships  . Social  connections:    Talks on phone: Once a week    Gets together: Once a week    Attends religious service: Never    Active member of club or organization: No    Attends meetings of clubs or organizations: Never    Relationship status: Divorced  . Intimate partner violence:    Fear of current or ex partner: Patient refused    Emotionally abused: Patient refused    Physically abused: Patient refused    Forced sexual activity: Patient refused  Other Topics Concern  . Not on file  Social History Narrative   Pt is from [place]. Moved to [place] at age 86. Divorced, one child, joint custody.     Family History  Problem Relation Age of Onset  . Lung cancer Paternal Grandfather   . Lung disease Paternal Grandfather     ROS: no fevers or chills, productive cough, hemoptysis, dysphasia, odynophagia, melena, hematochezia, dysuria, hematuria, rash, seizure activity, orthopnea, PND, pedal edema, claudication. Remaining systems are negative.  Physical Exam:   Blood pressure (!) 132/98, pulse 93, height 6\' 1"  (1.854 m), weight 216 lb (98 kg).  General:  Well developed/well nourished in NAD Skin warm/dry Patient not depressed No peripheral clubbing Back-normal HEENT-normal/normal eyelids Neck supple/normal carotid upstroke bilaterally; no bruits; no JVD; no thyromegaly chest - CTA/ normal expansion CV - RRR/normal S1 and  S2; no murmurs, rubs or gallops;  PMI nondisplaced Abdomen -NT/ND, no HSM, no mass, + bowel sounds, no bruit 2+ femoral pulses, no bruits Ext-no edema, chords, 2+ DP Neuro-grossly nonfocal  ECG -July 11, 2018-normal sinus rhythm.  Personally reviewed  A/P  1 palpitations-symptoms sound to be PACs or PVCs based on description.  Recent TSH and potassium normal.  I will add Toprol 25 mg daily both for palpitations and blood pressure.  Schedule echocardiogram to assess LV function.  If symptoms worsen we will consider an event monitor.  2 hypertension-patient's blood  pressure is elevated and he states typically elevated when it is checked.  I will add Toprol both for blood pressure and palpitations.  Follow blood pressure and adjust regimen as needed.  3 dyspnea-occasional mild dyspnea.  Echocardiogram to assess LV function.  Olga Millers, MD

## 2018-09-15 ENCOUNTER — Encounter: Payer: Self-pay | Admitting: Cardiology

## 2018-09-15 ENCOUNTER — Ambulatory Visit (INDEPENDENT_AMBULATORY_CARE_PROVIDER_SITE_OTHER): Payer: BLUE CROSS/BLUE SHIELD | Admitting: Cardiology

## 2018-09-15 VITALS — BP 132/98 | HR 93 | Ht 73.0 in | Wt 216.0 lb

## 2018-09-15 DIAGNOSIS — R0600 Dyspnea, unspecified: Secondary | ICD-10-CM | POA: Diagnosis not present

## 2018-09-15 DIAGNOSIS — I10 Essential (primary) hypertension: Secondary | ICD-10-CM | POA: Diagnosis not present

## 2018-09-15 DIAGNOSIS — R002 Palpitations: Secondary | ICD-10-CM | POA: Diagnosis not present

## 2018-09-15 MED ORDER — METOPROLOL SUCCINATE ER 25 MG PO TB24: 25.0000 mg | ORAL_TABLET | Freq: Every day | ORAL | 3 refills | Status: DC

## 2018-09-15 NOTE — Patient Instructions (Signed)
Medication Instructions:  START METOPROLOL SUCC ER 25 MG ONCE DAILY AT BEDTIME If you need a refill on your cardiac medications before your next appointment, please call your pharmacy.   Lab work: If you have labs (blood work) drawn today and your tests are completely normal, you will receive your results only by: Marland Kitchen MyChart Message (if you have MyChart) OR . A paper copy in the mail If you have any lab test that is abnormal or we need to change your treatment, we will call you to review the results.  Testing/Procedures: Your physician has requested that you have an echocardiogram. Echocardiography is a painless test that uses sound waves to create images of your heart. It provides your doctor with information about the size and shape of your heart and how well your heart's chambers and valves are working. This procedure takes approximately one hour. There are no restrictions for this procedure.    Follow-Up: At [facility], you and your health needs are our priority.  As part of our continuing mission to provide you with exceptional heart care, we have created designated Provider Care Teams.  These Care Teams include your primary Cardiologist (physician) and Advanced Practice Providers (APPs -  Physician Assistants and Nurse Practitioners) who all work together to provide you with the care you need, when you need it. You will need a follow up appointment in 6 months.  Please call our office 2 months in advance to schedule this appointment.  You may see Olga Millers MD or one of the following Advanced Practice Providers on your designated Care Team:   Corine Shelter, PA-C Judy Pimple, [place] . Marjie Skiff, PA-C

## 2018-09-30 ENCOUNTER — Encounter: Payer: Self-pay | Admitting: Physician Assistant

## 2018-10-01 ENCOUNTER — Other Ambulatory Visit: Payer: Self-pay

## 2018-10-01 ENCOUNTER — Ambulatory Visit (HOSPITAL_COMMUNITY): Payer: BLUE CROSS/BLUE SHIELD | Attending: Cardiology

## 2018-10-01 DIAGNOSIS — R002 Palpitations: Secondary | ICD-10-CM | POA: Insufficient documentation

## 2018-10-04 ENCOUNTER — Other Ambulatory Visit: Payer: Self-pay | Admitting: Physician Assistant

## 2018-10-04 DIAGNOSIS — F419 Anxiety disorder, unspecified: Principal | ICD-10-CM

## 2018-10-04 DIAGNOSIS — F32A Depression, unspecified: Secondary | ICD-10-CM

## 2018-10-04 DIAGNOSIS — F329 Major depressive disorder, single episode, unspecified: Secondary | ICD-10-CM

## 2018-10-06 ENCOUNTER — Other Ambulatory Visit: Payer: Self-pay | Admitting: *Deleted

## 2018-10-06 DIAGNOSIS — F32A Depression, unspecified: Secondary | ICD-10-CM

## 2018-10-06 DIAGNOSIS — F419 Anxiety disorder, unspecified: Principal | ICD-10-CM

## 2018-10-06 DIAGNOSIS — F329 Major depressive disorder, single episode, unspecified: Secondary | ICD-10-CM

## 2018-10-06 DIAGNOSIS — R002 Palpitations: Secondary | ICD-10-CM

## 2018-10-06 MED ORDER — ESCITALOPRAM OXALATE 10 MG PO TABS: 10.0000 mg | ORAL_TABLET | Freq: Every day | ORAL | 1 refills | Status: DC

## 2018-10-06 NOTE — Telephone Encounter (Signed)
Requested Prescriptions  Pending Prescriptions Disp Refills  . escitalopram (LEXAPRO) 10 MG tablet [Pharmacy Med Name: ESCITALOPRAM 10MG  TABLETS] 90 tablet 1    Sig: TAKE 1 TABLET(10 MG) BY MOUTH DAILY     Psychiatry:  Antidepressants - SSRI Passed - 10/04/2018  2:37 PM      Passed - Valid encounter within last 6 months    Recent Outpatient Visits          1 month ago Anxiety and depression   Primary [facility] at [place], [place] D, PA-C   2 months ago Annual physical exam   Primary Care at [place], [place] D, PA-C   3 months ago Skin abrasion   Primary [facility] at [facility], Gerald Stabs, PA-C      Future Appointments            In 1 month Barnett Abu, Gerald Stabs, PA-C Primary Care at [place], [facility]

## 2018-10-08 ENCOUNTER — Telehealth: Payer: Self-pay | Admitting: Physician Assistant

## 2018-10-08 MED ORDER — METOPROLOL SUCCINATE ER 25 MG PO TB24: 50.0000 mg | ORAL_TABLET | Freq: Every day | ORAL | 3 refills | Status: DC

## 2018-10-08 NOTE — Telephone Encounter (Signed)
°  LVM for pt to call the office and get rescheduled due to [facility]. I advised of the providers that are accepting new patients. If/When the pt calls back, please reschedule with Ryan Mcconnell, [place] or [place] for an OV for 3 month f/u medication check.  Thank you!

## 2018-10-14 ENCOUNTER — Ambulatory Visit (INDEPENDENT_AMBULATORY_CARE_PROVIDER_SITE_OTHER): Payer: BLUE CROSS/BLUE SHIELD

## 2018-10-14 DIAGNOSIS — R002 Palpitations: Secondary | ICD-10-CM

## 2018-11-10 ENCOUNTER — Ambulatory Visit: Payer: BLUE CROSS/BLUE SHIELD | Admitting: Physician Assistant

## 2018-11-17 ENCOUNTER — Other Ambulatory Visit: Payer: Self-pay | Admitting: *Deleted

## 2018-11-17 MED ORDER — METOPROLOL SUCCINATE ER 50 MG PO TB24: 50.0000 mg | ORAL_TABLET | Freq: Every day | ORAL | 3 refills | Status: DC

## 2018-12-07 ENCOUNTER — Emergency Department (HOSPITAL_COMMUNITY)
Admission: EM | Admit: 2018-12-07 | Discharge: 2018-12-07 | Disposition: A | Discharge status: Home / Self Care | Payer: BLUE CROSS/BLUE SHIELD | Attending: Emergency Medicine | Admitting: Emergency Medicine

## 2018-12-07 ENCOUNTER — Other Ambulatory Visit: Payer: Self-pay

## 2018-12-07 ENCOUNTER — Emergency Department (HOSPITAL_COMMUNITY): Payer: BLUE CROSS/BLUE SHIELD

## 2018-12-07 ENCOUNTER — Encounter (HOSPITAL_COMMUNITY): Payer: Self-pay

## 2018-12-07 DIAGNOSIS — W19XXXA Unspecified fall, initial encounter: Secondary | ICD-10-CM

## 2018-12-07 DIAGNOSIS — S6991XA Unspecified injury of right wrist, hand and finger(s), initial encounter: Secondary | ICD-10-CM | POA: Diagnosis present

## 2018-12-07 DIAGNOSIS — I10 Essential (primary) hypertension: Secondary | ICD-10-CM | POA: Insufficient documentation

## 2018-12-07 DIAGNOSIS — Y9355 Activity, bike riding: Secondary | ICD-10-CM | POA: Diagnosis not present

## 2018-12-07 DIAGNOSIS — S63501A Unspecified sprain of right wrist, initial encounter: Secondary | ICD-10-CM | POA: Diagnosis not present

## 2018-12-07 DIAGNOSIS — Y92410 Unspecified street and highway as the place of occurrence of the external cause: Secondary | ICD-10-CM | POA: Insufficient documentation

## 2018-12-07 DIAGNOSIS — Y998 Other external cause status: Secondary | ICD-10-CM | POA: Insufficient documentation

## 2018-12-07 DIAGNOSIS — Z79899 Other long term (current) drug therapy: Secondary | ICD-10-CM | POA: Insufficient documentation
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
## 2018-12-07 NOTE — Discharge Instructions (Signed)
Wear wrist brace at all times until you see the hand specialist. Ice and elevate wrist throughout the day, using ice pack for no more than 20 minutes every hour.  Alternate between tylenol and motrin as needed for pain relief. Follow up with the hand specialist in 5-7 days for recheck of symptoms and ongoing management of your wrist injury. Return to the ER for changes or worsening symptoms.
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
## 2019-02-05 ENCOUNTER — Other Ambulatory Visit: Payer: Self-pay | Admitting: Orthopedic Surgery

## 2019-02-05 DIAGNOSIS — M24131 Other articular cartilage disorders, right wrist: Secondary | ICD-10-CM

## 2019-02-06 ENCOUNTER — Other Ambulatory Visit: Payer: Self-pay | Admitting: Orthopedic Surgery

## 2019-02-06 DIAGNOSIS — M24131 Other articular cartilage disorders, right wrist: Secondary | ICD-10-CM

## 2019-02-16 ENCOUNTER — Other Ambulatory Visit: Payer: BLUE CROSS/BLUE SHIELD

## 2019-04-21 ENCOUNTER — Telehealth: Payer: Self-pay | Admitting: *Deleted

## 2019-04-21 NOTE — Telephone Encounter (Signed)
I spoke with,Mr.Ryan Mcconnell today,re:follow up visit with Dr.Crenshaw.He stated [employer] has changed to [facility]. I forward a message to Stanton Kidney M.,for a [facility] for him.
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
## 2019-10-28 ENCOUNTER — Other Ambulatory Visit: Payer: Self-pay

## 2019-10-28 MED ORDER — METOPROLOL SUCCINATE ER 50 MG PO TB24: 50.0000 mg | ORAL_TABLET | Freq: Every day | ORAL | 3 refills | Status: DC

## 2020-10-17 ENCOUNTER — Other Ambulatory Visit: Payer: Self-pay | Admitting: Cardiology

## 2024-01-26 ENCOUNTER — Emergency Department (HOSPITAL_BASED_OUTPATIENT_CLINIC_OR_DEPARTMENT_OTHER)
Admission: EM | Admit: 2024-01-26 | Discharge: 2024-01-26 | Disposition: A | Discharge status: Home / Self Care | Payer: Self-pay | Attending: Emergency Medicine | Admitting: Emergency Medicine

## 2024-01-26 ENCOUNTER — Other Ambulatory Visit: Payer: Self-pay

## 2024-01-26 ENCOUNTER — Encounter (HOSPITAL_BASED_OUTPATIENT_CLINIC_OR_DEPARTMENT_OTHER): Payer: Self-pay

## 2024-01-26 DIAGNOSIS — J101 Influenza due to other identified influenza virus with other respiratory manifestations: Secondary | ICD-10-CM | POA: Insufficient documentation

## 2024-01-26 LAB — RESP PANEL BY RT-PCR (RSV, FLU A&B, COVID)  RVPGX2
Influenza A by PCR: POSITIVE — AB
Influenza B by PCR: NEGATIVE
Resp Syncytial Virus by PCR: NEGATIVE
SARS Coronavirus 2 by RT PCR: NEGATIVE

## 2024-01-26 MED ORDER — OSELTAMIVIR PHOSPHATE 75 MG PO CAPS: 75.0000 mg | ORAL_CAPSULE | Freq: Two times a day (BID) | ORAL | 0 refills | Status: DC

## 2024-01-26 NOTE — ED Provider Notes (Signed)
 [place] EMERGENCY DEPARTMENT AT [facility] Provider Note   CSN: [id number] Arrival date & time: 01/26/24  2213     History  Chief Complaint  Patient presents with   URI    BRAELON SPRUNG is a 43 y.o. male.  Patient is a 43 year old male presenting with complaints of bodyaches, headache, fever, cough, and weakness.  This started suddenly this morning.  He has been taking Mucinex with little relief.  He denies any ill contacts.  The history is provided by the patient.       Home Medications Prior to Admission medications   Medication Sig Start Date End Date Taking? Authorizing Provider  escitalopram (LEXAPRO) 10 MG tablet Take 1 tablet (10 mg total) by mouth daily. 10/06/18   Benjiman Core D, PA-C  hydrOXYzine (ATARAX/VISTARIL) 25 MG tablet Take 0.5-1 tablets (12.5-25 mg total) by mouth at bedtime as needed. 07/11/18   Benjiman Core D, PA-C  metoprolol succinate (TOPROL-XL) 50 MG 24 hr tablet TAKE 1 TABLET(50 MG) BY MOUTH DAILY 10/17/20   Lewayne Bunting, MD      Allergies    Patient has no known allergies.    Review of Systems   Review of Systems  All other systems reviewed and are negative.   Physical Exam Updated Vital Signs BP (!) 143/97   Pulse 99   Temp 99.8 F (37.7 C) (Oral)   Resp 16   SpO2 95%  Physical Exam Vitals and nursing note reviewed.  Constitutional:      General: He is not in acute distress.    Appearance: He is well-developed. He is not diaphoretic.  HENT:     Head: Normocephalic and atraumatic.  Cardiovascular:     Rate and Rhythm: Normal rate and regular rhythm.     Heart sounds: No murmur heard.    No friction rub.  Pulmonary:     Effort: Pulmonary effort is normal. No respiratory distress.     Breath sounds: Normal breath sounds. No wheezing or rales.  Abdominal:     General: Bowel sounds are normal. There is no distension.     Palpations: Abdomen is soft.     Tenderness: There is no abdominal tenderness.   Musculoskeletal:        General: Normal range of motion.     Cervical back: Normal range of motion and neck supple.  Skin:    General: Skin is warm and dry.  Neurological:     Mental Status: He is alert and oriented to person, place, and time.     Coordination: Coordination normal.     ED Results / Procedures / Treatments   Labs (all labs ordered are listed, but only abnormal results are displayed) Labs Reviewed  RESP PANEL BY RT-PCR (RSV, FLU A&B, COVID)  RVPGX2 - Abnormal; Notable for the following components:      Result Value   Influenza A by PCR POSITIVE (*)    All other components within normal limits    EKG None  Radiology No results found.  Procedures Procedures    Medications Ordered in ED Medications - No data to display  ED Course/ Medical Decision Making/ A&P  Patient is a 43 year old male presenting with URI symptoms as described in the HPI.  He arrives here with stable vital signs and is borderline febrile with temp of 99.8.  Physical examination otherwise unremarkable.  Lungs are clear.  Respiratory panel positive for influenza A.  Patient to be treated with Tamiflu, rotating Tylenol/Motrin,  over-the-counter medications, and follow-up as needed.  Final Clinical Impression(s) / ED Diagnoses Final diagnoses:  None    Rx / DC Orders ED Discharge Orders     None         Geoffery Lyons, MD 01/26/24 2328

## 2024-01-26 NOTE — Discharge Instructions (Addendum)
 Begin taking Tamiflu as prescribed.  Take Tylenol 1000 mg rotated with ibuprofen 600 mg every 3 hours as needed for pain or fever.  Drink plenty of fluids and get plenty of rest.  Take over-the-counter medications as needed for relief of symptoms.

## 2024-01-26 NOTE — ED Triage Notes (Signed)
 Pt c/o URI symptoms. Woke up this AM w bad cough, up to midgrade fever (tmax 101.7), chest congestion, NV  OTC meds "take the edge off."

## 2024-10-11 ENCOUNTER — Encounter (HOSPITAL_COMMUNITY): Payer: Self-pay

## 2024-10-11 ENCOUNTER — Ambulatory Visit (HOSPITAL_COMMUNITY)
Admission: EM | Admit: 2024-10-11 | Discharge: 2024-10-11 | Disposition: A | Discharge status: Home / Self Care | Attending: Physician Assistant | Admitting: Physician Assistant

## 2024-10-11 DIAGNOSIS — L03032 Cellulitis of left toe: Secondary | ICD-10-CM

## 2024-10-11 MED ORDER — MUPIROCIN 2 % EX OINT: 1.0000 | TOPICAL_OINTMENT | Freq: Two times a day (BID) | CUTANEOUS | 0 refills | Status: AC

## 2024-10-11 MED ORDER — AMOXICILLIN-POT CLAVULANATE 875-125 MG PO TABS: 1.0000 | ORAL_TABLET | Freq: Two times a day (BID) | ORAL | 0 refills | Status: AC
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
## 2024-10-11 NOTE — ED Triage Notes (Signed)
 Ingrown toenail on the left great toe. Onset a few weeks ago. Has been on and off but currently swollen and slight drainage.

## 2024-10-11 NOTE — Discharge Instructions (Signed)
 I believe that you have an infection in the toenail.  Take Augmentin twice daily.  Take this with food as it can upset your stomach.  Continue with warm Epsom salt soaks and apply Bactroban ointment twice daily with dressing changes.  I would like you to follow-up with podiatrist for further evaluation and management.  If anything worsens this becomes discolored, more painful, you develop fever, nausea, vomiting, numbness or tingling in the toe you should be seen immediately.

## 2024-10-15 ENCOUNTER — Ambulatory Visit: Admitting: Podiatry

## 2024-10-15 ENCOUNTER — Ambulatory Visit (INDEPENDENT_AMBULATORY_CARE_PROVIDER_SITE_OTHER)

## 2024-10-15 ENCOUNTER — Encounter: Payer: Self-pay | Admitting: Podiatry

## 2024-10-15 VITALS — Ht 73.0 in | Wt 220.0 lb

## 2024-10-15 DIAGNOSIS — M19072 Primary osteoarthritis, left ankle and foot: Secondary | ICD-10-CM | POA: Diagnosis not present

## 2024-10-15 DIAGNOSIS — M19071 Primary osteoarthritis, right ankle and foot: Secondary | ICD-10-CM | POA: Diagnosis not present

## 2024-10-15 DIAGNOSIS — L600 Ingrowing nail: Secondary | ICD-10-CM | POA: Diagnosis not present

## 2024-10-15 NOTE — Patient Instructions (Signed)

## 2024-10-15 NOTE — Progress Notes (Signed)
 Subjective:   Patient ID: Ryan Mcconnell, male   DOB: 43 y.o.   MRN: [id number]   HPI Patient presents with severe bilateral ingrown toenail deformity of both feet.  States that he has tried to trim and soak them without relief of symptoms and that this has been going on for a long time.  Patient also has significant midfoot pain right and is tentatively scheduled to have surgery on the foot at 1 point in the future with history of CT scan but has not been looked at for a fairly long time.  Patient does not smoke likes to be active   Review of Systems  All other systems reviewed and are negative.       Objective:  Physical Exam Vitals and nursing note reviewed.  Constitutional:      Appearance: He is well-developed.  Pulmonary:     Effort: Pulmonary effort is normal.  Musculoskeletal:        General: Normal range of motion.  Skin:    General: Skin is warm.  Neurological:     Mental Status: He is alert.     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  I went ahead today did do x-ray right indicated probable arthritis second metatarsal cuneiform joint and I discussed this with him and reviewed his CT scan with him

## 2024-12-07 ENCOUNTER — Ambulatory Visit: Admitting: Podiatry
